# Patient Record
Sex: Male | Born: 1985 | Race: White | Hispanic: No | Marital: Single | Smoking: Current every day smoker
Health system: Southern US, Community
[De-identification: ages and names within clinical notes are randomized; demographics above are authoritative.]

## PROBLEM LIST (undated history)

## (undated) DIAGNOSIS — R233 Spontaneous ecchymoses: Secondary | ICD-10-CM

## (undated) DIAGNOSIS — R238 Other skin changes: Secondary | ICD-10-CM

## (undated) HISTORY — DX: Spontaneous ecchymoses: R23.3

## (undated) HISTORY — DX: Other skin changes: R23.8

---
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
## 2005-07-23 ENCOUNTER — Emergency Department (HOSPITAL_COMMUNITY): Admission: EM | Admit: 2005-07-23 | Discharge: 2005-07-24 | Payer: Self-pay | Admitting: Emergency Medicine

## 2011-11-18 ENCOUNTER — Encounter (HOSPITAL_COMMUNITY): Payer: Self-pay | Admitting: *Deleted

## 2011-11-18 ENCOUNTER — Emergency Department (HOSPITAL_COMMUNITY): Payer: PRIVATE HEALTH INSURANCE

## 2011-11-18 ENCOUNTER — Observation Stay (HOSPITAL_COMMUNITY)
Admission: EM | Admit: 2011-11-18 | Discharge: 2011-11-19 | Disposition: A | Discharge status: Home / Self Care | Payer: PRIVATE HEALTH INSURANCE | Attending: General Surgery | Admitting: General Surgery

## 2011-11-18 DIAGNOSIS — S71009A Unspecified open wound, unspecified hip, initial encounter: Principal | ICD-10-CM | POA: Insufficient documentation

## 2011-11-18 DIAGNOSIS — F1010 Alcohol abuse, uncomplicated: Secondary | ICD-10-CM | POA: Insufficient documentation

## 2011-11-18 DIAGNOSIS — F17200 Nicotine dependence, unspecified, uncomplicated: Secondary | ICD-10-CM | POA: Insufficient documentation

## 2011-11-18 DIAGNOSIS — T07XXXA Unspecified multiple injuries, initial encounter: Secondary | ICD-10-CM | POA: Diagnosis present

## 2011-11-18 DIAGNOSIS — S41009A Unspecified open wound of unspecified shoulder, initial encounter: Secondary | ICD-10-CM | POA: Insufficient documentation

## 2011-11-18 DIAGNOSIS — D62 Acute posthemorrhagic anemia: Secondary | ICD-10-CM | POA: Insufficient documentation

## 2011-11-18 DIAGNOSIS — S71109A Unspecified open wound, unspecified thigh, initial encounter: Secondary | ICD-10-CM

## 2011-11-18 DIAGNOSIS — Y998 Other external cause status: Secondary | ICD-10-CM | POA: Insufficient documentation

## 2011-11-18 DIAGNOSIS — S21209A Unspecified open wound of unspecified back wall of thorax without penetration into thoracic cavity, initial encounter: Secondary | ICD-10-CM

## 2011-11-18 LAB — POCT I-STAT, CHEM 8
BUN: 10 mg/dL (ref 6–23)
Calcium, Ion: 1.19 mmol/L (ref 1.12–1.23)
Creatinine, Ser: 1.2 mg/dL (ref 0.50–1.35)
TCO2: 25 mmol/L (ref 0–100)

## 2011-11-18 LAB — CBC
MCHC: 34.4 g/dL (ref 30.0–36.0)
RDW: 12.2 % (ref 11.5–15.5)

## 2011-11-18 LAB — COMPREHENSIVE METABOLIC PANEL
ALT: 22 U/L (ref 0–53)
Alkaline Phosphatase: 76 U/L (ref 39–117)
CO2: 27 mEq/L (ref 19–32)
GFR calc Af Amer: >90 mL/min (ref >90)
GFR calc non Af Amer: >90 mL/min (ref >90)
Glucose, Bld: 125 mg/dL — ABNORMAL HIGH (ref 70–99)
Potassium: 3.5 mEq/L (ref 3.5–5.1)
Sodium: 141 mEq/L (ref 135–145)
Total Bilirubin: 0.3 mg/dL (ref 0.3–1.2)

## 2011-11-18 LAB — PROTIME-INR: Prothrombin Time: 13.5 seconds (ref 11.6–15.2)

## 2011-11-18 LAB — LACTIC ACID, PLASMA: Lactic Acid, Venous: 3.2 mmol/L — ABNORMAL HIGH (ref 0.5–2.2)

## 2011-11-18 MED ORDER — MORPHINE SULFATE 2 MG/ML IJ SOLN: 2.0000 mg | INTRAMUSCULAR | Status: DC | PRN

## 2011-11-18 MED ORDER — ENOXAPARIN SODIUM 40 MG/0.4ML ~~LOC~~ SOLN
40.0000 mg | SUBCUTANEOUS | Status: DC
  Filled 2011-11-18: qty 0.4

## 2011-11-18 MED ORDER — ONDANSETRON HCL 4 MG/2ML IJ SOLN: 4.0000 mg | Freq: Four times a day (QID) | INTRAMUSCULAR | Status: DC | PRN

## 2011-11-18 MED ORDER — TETANUS-DIPHTH-ACELL PERTUSSIS 5-2.5-18.5 LF-MCG/0.5 IM SUSP
INTRAMUSCULAR | Status: AC
  Administered 2011-11-18: 0.5 mL via INTRAMUSCULAR
  Filled 2011-11-18: qty 0.5

## 2011-11-18 MED ORDER — TETANUS-DIPHTHERIA TOXOIDS TD 5-2 LFU IM INJ: 0.5000 mL | INJECTION | Freq: Once | INTRAMUSCULAR | Status: DC

## 2011-11-18 MED ORDER — OXYCODONE HCL 5 MG PO TABS: 2.5000 mg | ORAL_TABLET | ORAL | Status: DC | PRN

## 2011-11-18 MED ORDER — MORPHINE SULFATE 4 MG/ML IJ SOLN
4.0000 mg | INTRAMUSCULAR | Status: DC | PRN
  Administered 2011-11-19: 4 mg via INTRAVENOUS
  Filled 2011-11-18: qty 1

## 2011-11-18 MED ORDER — MORPHINE SULFATE 4 MG/ML IJ SOLN
4.0000 mg | Freq: Once | INTRAMUSCULAR | Status: AC
  Administered 2011-11-18: 4 mg via INTRAVENOUS

## 2011-11-18 MED ORDER — CEFAZOLIN SODIUM 1-5 GM-% IV SOLN
1.0000 g | Freq: Once | INTRAVENOUS | Status: AC
  Administered 2011-11-18: 1 g via INTRAVENOUS
  Filled 2011-11-18: qty 50

## 2011-11-18 MED ORDER — SODIUM CHLORIDE 0.9 % IV BOLUS (SEPSIS)
1000.0000 mL | Freq: Once | INTRAVENOUS | Status: AC
  Administered 2011-11-18: 500 mL via INTRAVENOUS

## 2011-11-18 MED ORDER — OXYCODONE HCL 5 MG PO TABS: 10.0000 mg | ORAL_TABLET | ORAL | Status: DC | PRN

## 2011-11-18 MED ORDER — KETOROLAC TROMETHAMINE 30 MG/ML IJ SOLN
30.0000 mg | Freq: Once | INTRAMUSCULAR | Status: AC
  Administered 2011-11-18: 30 mg via INTRAVENOUS
  Filled 2011-11-18: qty 1

## 2011-11-18 MED ORDER — ONDANSETRON HCL 4 MG PO TABS: 4.0000 mg | ORAL_TABLET | Freq: Four times a day (QID) | ORAL | Status: DC | PRN

## 2011-11-18 MED ORDER — MORPHINE SULFATE 4 MG/ML IJ SOLN
INTRAMUSCULAR | Status: AC
  Filled 2011-11-18: qty 1

## 2011-11-18 MED ORDER — CEFAZOLIN SODIUM 1-5 GM-% IV SOLN
1.0000 g | Freq: Three times a day (TID) | INTRAVENOUS | Status: DC
  Administered 2011-11-19: 1 g via INTRAVENOUS
  Filled 2011-11-18 (×3): qty 50

## 2011-11-18 MED ORDER — SODIUM CHLORIDE 0.9 % IV SOLN
INTRAVENOUS | Status: DC
  Administered 2011-11-18: 23:00:00 via INTRAVENOUS

## 2011-11-18 MED ORDER — OXYCODONE HCL 5 MG PO TABS
5.0000 mg | ORAL_TABLET | ORAL | Status: DC | PRN
  Administered 2011-11-19: 5 mg via ORAL
  Filled 2011-11-18: qty 1

## 2011-11-18 MED ORDER — TETANUS-DIPHTH-ACELL PERTUSSIS 5-2.5-18.5 LF-MCG/0.5 IM SUSP
0.5000 mL | Freq: Once | INTRAMUSCULAR | Status: AC
  Administered 2011-11-18: 0.5 mL via INTRAMUSCULAR
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
## 2011-11-18 NOTE — ED Notes (Signed)
Dr. Magnus Ivan at bedside to assess back wound
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
## 2011-11-18 NOTE — ED Notes (Signed)
Sheriff deputy at bedside to speak with pt
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
## 2011-11-19 ENCOUNTER — Observation Stay (HOSPITAL_COMMUNITY): Payer: PRIVATE HEALTH INSURANCE

## 2011-11-19 DIAGNOSIS — Z7289 Other problems related to lifestyle: Secondary | ICD-10-CM | POA: Insufficient documentation

## 2011-11-19 DIAGNOSIS — D62 Acute posthemorrhagic anemia: Secondary | ICD-10-CM | POA: Diagnosis not present

## 2011-11-19 DIAGNOSIS — Z720 Tobacco use: Secondary | ICD-10-CM | POA: Insufficient documentation

## 2011-11-19 LAB — CBC
Hemoglobin: 12 g/dL — ABNORMAL LOW (ref 13.0–17.0)
MCH: 32.3 pg (ref 26.0–34.0)
MCHC: 33.6 g/dL (ref 30.0–36.0)
MCV: 96.2 fL (ref 78.0–100.0)
Platelets: 221 10*3/uL (ref 150–400)

## 2011-11-19 MED ORDER — BACITRACIN-NEOMYCIN-POLYMYXIN 400-5-5000 EX OINT
TOPICAL_OINTMENT | CUTANEOUS | Status: AC
  Filled 2011-11-19: qty 2

## 2011-11-19 MED ORDER — METHOCARBAMOL 500 MG PO TABS: 500.0000 mg | ORAL_TABLET | Freq: Four times a day (QID) | ORAL | Status: AC | PRN

## 2011-11-19 MED ORDER — HYDROCODONE-ACETAMINOPHEN 5-325 MG PO TABS: 1.0000 | ORAL_TABLET | ORAL | Status: AC | PRN

## 2011-11-19 NOTE — Discharge Summary (Signed)
Jerry Schillo, MD, MPH, FACS Pager: [phone]  

## 2011-11-19 NOTE — Progress Notes (Signed)
Agree Jafet Wissing, MD, MPH, FACS Pager: [phone]  
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
## 2011-11-19 NOTE — Progress Notes (Signed)
Patient ID: Jerry Wu, male   DOB: 12/29/1985, 26 y.o.   MRN: [id number]   LOS: 1 day   Subjective: Wounds have started to hurt some after he got up to go get his x-ray but they're tolerable.  Objective: Vital signs in last 24 hours: Temp:  [98 F (36.7 C)-98.4 F (36.9 C)] 98.2 F (36.8 C) (08/15 0531) Pulse Rate:  [66-116] 66  (08/15 0531) Resp:  [14-34] 18  (08/15 0531) BP: (118-176)/(63-114) 118/63 mmHg (08/15 0531) SpO2:  [99 %-100 %] 99 % (08/15 0531) Weight:  [105.9 kg (233 lb 7.5 oz)] 105.9 kg (233 lb 7.5 oz) (08/14 2222)    Lab Results:  CBC  Basename 11/19/11 0640 11/18/11 2032 11/18/11 2014  WBC 10.1 -- 11.2*  HGB 12.0* 15.3 --  HCT 35.7* 45.0 --  PLT 221 -- 264    CHEST - 2 VIEW  Comparison: None.  Findings: Low lung volumes. Mild left basilar atelectasis.  Otherwise clear lungs. No pneumothorax. Staples over the  posterior left chest soft tissues. No obvious acute bony  deformity. No pleural effusion.  IMPRESSION:  Minimal left basilar atelectasis. Otherwise, no evidence of acute  cardiopulmonary disease. Specifically, no pneumothorax, pleural  effusion, or mediastinal widening.  Original Report Authenticated By: Donavan Burnet, M.D.   General appearance: alert and no distress Resp: clear to auscultation bilaterally Cardio: regular rate and rhythm GI: normal findings: bowel sounds normal and soft, non-tender Incision/Wound:Intact. Upper back wound has moderate hematoma underlying it.   Assessment/Plan: Mult SW back/left thigh ABL anemia -- Mild Tobacco/EtOH use Dispo -- Home    Freeman Caldron, PA-C Pager: [phone] General Trauma PA Pager: [phone]   11/19/2011
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
## 2011-12-01 ENCOUNTER — Emergency Department (HOSPITAL_COMMUNITY)
Admission: EM | Admit: 2011-12-01 | Discharge: 2011-12-01 | Disposition: A | Discharge status: Home / Self Care | Payer: PRIVATE HEALTH INSURANCE | Attending: Emergency Medicine | Admitting: Emergency Medicine

## 2011-12-01 ENCOUNTER — Encounter (HOSPITAL_COMMUNITY): Payer: Self-pay | Admitting: Emergency Medicine

## 2011-12-01 DIAGNOSIS — L089 Local infection of the skin and subcutaneous tissue, unspecified: Secondary | ICD-10-CM | POA: Insufficient documentation

## 2011-12-01 DIAGNOSIS — F17200 Nicotine dependence, unspecified, uncomplicated: Secondary | ICD-10-CM | POA: Insufficient documentation

## 2011-12-01 DIAGNOSIS — S21209A Unspecified open wound of unspecified back wall of thorax without penetration into thoracic cavity, initial encounter: Secondary | ICD-10-CM | POA: Insufficient documentation

## 2011-12-01 DIAGNOSIS — T148XXA Other injury of unspecified body region, initial encounter: Secondary | ICD-10-CM | POA: Insufficient documentation

## 2011-12-01 DIAGNOSIS — T07XXXA Unspecified multiple injuries, initial encounter: Secondary | ICD-10-CM

## 2011-12-01 DIAGNOSIS — M79609 Pain in unspecified limb: Secondary | ICD-10-CM

## 2011-12-01 MED ORDER — CEPHALEXIN 500 MG PO CAPS: 500.0000 mg | ORAL_CAPSULE | Freq: Four times a day (QID) | ORAL | Status: AC
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
## 2011-12-01 NOTE — ED Provider Notes (Signed)
Medical screening examination/treatment/procedure(s) were performed by non-physician practitioner and as supervising physician I was immediately available for consultation/collaboration.   Gerhard Munch, MD 12/01/11 1616

## 2011-12-01 NOTE — ED Notes (Signed)
Pt c/o lower back bruise noticed 4 days ago. States that he is having pain to left calf that is worse when walking.

## 2011-12-01 NOTE — Progress Notes (Signed)
VASCULAR LAB PRELIMINARY  PRELIMINARY  PRELIMINARY  PRELIMINARY  Left leg venous duplex completed.    Preliminary report: No obvious DVT noted in the left leg.    Nalini Alcaraz, 12/01/2011, 3:25 PM
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
## 2011-12-01 NOTE — ED Notes (Signed)
PT HAS RETURNED FROM [facility]

## 2011-12-01 NOTE — ED Notes (Signed)
Patient has a laceration to distal inner thigh that is healing with staples intact. There is redness along the staple line.. Pt denies fever. Also with resolving bruising ( fist size) to left flank at level of pelvis. And a resolving bruise to his ls spine. States has some back pain when he goes to work but is not having back pain at this time

## 2011-12-03 ENCOUNTER — Encounter (INDEPENDENT_AMBULATORY_CARE_PROVIDER_SITE_OTHER): Payer: Self-pay

## 2011-12-03 ENCOUNTER — Ambulatory Visit (INDEPENDENT_AMBULATORY_CARE_PROVIDER_SITE_OTHER): Payer: PRIVATE HEALTH INSURANCE | Admitting: Orthopedic Surgery

## 2011-12-03 VITALS — BP 138/84 | HR 68 | Temp 96.0°F | Resp 18 | Ht 68.0 in | Wt 227.1 lb

## 2011-12-03 DIAGNOSIS — L02419 Cutaneous abscess of limb, unspecified: Secondary | ICD-10-CM | POA: Insufficient documentation

## 2011-12-03 DIAGNOSIS — T07XXXA Unspecified multiple injuries, initial encounter: Secondary | ICD-10-CM

## 2011-12-03 DIAGNOSIS — L03119 Cellulitis of unspecified part of limb: Secondary | ICD-10-CM

## 2011-12-03 DIAGNOSIS — T148XXA Other injury of unspecified body region, initial encounter: Secondary | ICD-10-CM
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
## 2014-02-20 ENCOUNTER — Emergency Department (HOSPITAL_COMMUNITY)
Admission: EM | Admit: 2014-02-20 | Discharge: 2014-02-20 | Disposition: A | Discharge status: Home / Self Care | Payer: PRIVATE HEALTH INSURANCE | Attending: Emergency Medicine | Admitting: Emergency Medicine

## 2014-02-20 ENCOUNTER — Encounter (HOSPITAL_COMMUNITY): Payer: Self-pay | Admitting: *Deleted

## 2014-02-20 DIAGNOSIS — L03115 Cellulitis of right lower limb: Secondary | ICD-10-CM

## 2014-02-20 DIAGNOSIS — R110 Nausea: Secondary | ICD-10-CM | POA: Insufficient documentation

## 2014-02-20 DIAGNOSIS — Z720 Tobacco use: Secondary | ICD-10-CM | POA: Insufficient documentation

## 2014-02-20 LAB — BASIC METABOLIC PANEL
ANION GAP: 11 (ref 5–15)
BUN: 5 mg/dL — ABNORMAL LOW (ref 6–23)
CHLORIDE: 98 meq/L (ref 96–112)
CO2: 26 mEq/L (ref 19–32)
CREATININE: 0.82 mg/dL (ref 0.50–1.35)
Calcium: 9.5 mg/dL (ref 8.4–10.5)
GFR calc non Af Amer: >90 mL/min (ref >90)
Glucose, Bld: 94 mg/dL (ref 70–99)
POTASSIUM: 4.8 meq/L (ref 3.7–5.3)
Sodium: 135 mEq/L — ABNORMAL LOW (ref 137–147)

## 2014-02-20 LAB — CBC WITH DIFFERENTIAL/PLATELET
BASOS ABS: 0 10*3/uL (ref 0.0–0.1)
BASOS PCT: 0 % (ref 0–1)
Eosinophils Absolute: 0.2 10*3/uL (ref 0.0–0.7)
Eosinophils Relative: 2 % (ref 0–5)
HCT: 44.4 % (ref 39.0–52.0)
Hemoglobin: 15.1 g/dL (ref 13.0–17.0)
Lymphocytes Relative: 16 % (ref 12–46)
Lymphs Abs: 1.7 10*3/uL (ref 0.7–4.0)
MCH: 33 pg (ref 26.0–34.0)
MCHC: 34 g/dL (ref 30.0–36.0)
MCV: 96.9 fL (ref 78.0–100.0)
Monocytes Absolute: 1.4 10*3/uL — ABNORMAL HIGH (ref 0.1–1.0)
Monocytes Relative: 13 % — ABNORMAL HIGH (ref 3–12)
NEUTROS ABS: 7.7 10*3/uL (ref 1.7–7.7)
NEUTROS PCT: 69 % (ref 43–77)
PLATELETS: 227 10*3/uL (ref 150–400)
RBC: 4.58 MIL/uL (ref 4.22–5.81)
RDW: 12.6 % (ref 11.5–15.5)
WBC: 10.9 10*3/uL — ABNORMAL HIGH (ref 4.0–10.5)

## 2014-02-20 MED ORDER — HYDROCODONE-ACETAMINOPHEN 5-325 MG PO TABS: 1.0000 | ORAL_TABLET | Freq: Four times a day (QID) | ORAL | Status: AC | PRN

## 2014-02-20 MED ORDER — CLINDAMYCIN HCL 150 MG PO CAPS: 450.0000 mg | ORAL_CAPSULE | Freq: Three times a day (TID) | ORAL | Status: AC

## 2014-02-20 MED ORDER — CLINDAMYCIN PHOSPHATE 600 MG/50ML IV SOLN
600.0000 mg | Freq: Once | INTRAVENOUS | Status: AC
  Administered 2014-02-20: 600 mg via INTRAVENOUS
  Filled 2014-02-20: qty 50

## 2014-02-20 NOTE — ED Notes (Signed)
PA Parker at bedside. 

## 2014-02-20 NOTE — ED Provider Notes (Signed)
CSN: [id number]     Arrival date & time 02/20/14  1132 History   First MD Initiated Contact with Patient 02/20/14 1331     Chief Complaint  Patient presents with  . Cellulitis  . Leg Pain     (Consider location/radiation/quality/duration/timing/severity/associated sxs/prior Treatment) HPI Comments: The patient is a 28 year old male presenting to the emergency room chief complaint of right lower extremity redness and pain for 3 days. Patient reports worsening redness, pain with movement and pressure.  Patient denies known skin lesion. Reports nausea, no emesis. Denies fever or chills.  Patient is a 28 y.o. male presenting with leg pain. The history is provided by the patient. No language interpreter was used.  Leg Pain Associated symptoms: no fever     Past Medical History  Diagnosis Date  . Easy bruising    History reviewed. No pertinent past surgical history. Family History  Problem Relation Age of Onset  . Factor V Leiden deficiency Mother    History  Substance Use Topics  . Smoking status: Current Every Day Smoker -- 0.50 packs/day  . Smokeless tobacco: Not on file  . Alcohol Use: Yes     Comment: occasionally    Review of Systems  Constitutional: Negative for fever and chills.  Gastrointestinal: Positive for nausea. Negative for vomiting.  Skin: Positive for color change.      Allergies  Review of patient's allergies indicates no known allergies.  Home Medications   Prior to Admission medications   Not on File   BP 131/113 mmHg  Pulse 80  Temp(Src) 97.9 F (36.6 C) (Oral)  Resp 20  Ht 5\' 9"  (1.753 m)  Wt 210 lb (95.255 kg)  BMI 31.00 kg/m2  SpO2 97% Physical Exam  Constitutional: He is oriented to person, place, and time. He appears well-developed and well-nourished.  HENT:  Head: Normocephalic and atraumatic.  Neck: Neck supple.  Cardiovascular: Normal rate and regular rhythm.   Pulmonary/Chest: Effort normal. No respiratory distress.    Musculoskeletal: Normal range of motion.       Legs: Large erythremic lesion to dorsal aspect of lower limb. Increase in warmth to touch, minimal edema.  Neurological: He is alert and oriented to person, place, and time.  Nursing note and vitals reviewed.   ED Course  Procedures (including critical care time) Labs Review Labs Reviewed  CBC WITH DIFFERENTIAL - Abnormal; Notable for the following:    WBC 10.9 (*)    Monocytes Relative 13 (*)    Monocytes Absolute 1.4 (*)    All other components within normal limits  BASIC METABOLIC PANEL - Abnormal; Notable for the following:    Sodium 135 (*)    BUN 5 (*)    All other components within normal limits    Imaging Review No results found.   EKG Interpretation None      MDM   Final diagnoses:  Cellulitis of right lower extremity   Patient presents with cellulitis of right lower extremity, non-cercumfrential, afebrile. Discussed with Dr. Estell HarpinZammit who after evaluating the patient advises basic labs IV antibiotics. Plan to treat with antibiotics, follow-up in 48 hours for wound check. Discussed treatment plan with the patient. Return precautions given. Reports understanding and no other concerns at this time.  Patient is stable for discharge at this time. Meds given in ED:  Medications  clindamycin (CLEOCIN) IVPB 600 mg (0 mg Intravenous Stopped 02/20/14 1556)    New Prescriptions   CLINDAMYCIN (CLEOCIN) 150 MG CAPSULE    Take  3 capsules (450 mg total) by mouth 3 (three) times daily.   HYDROCODONE-ACETAMINOPHEN (NORCO/VICODIN) 5-325 MG PER TABLET    Take 1 tablet by mouth every 6 (six) hours as needed for moderate pain or severe pain.      Mellody DrownLauren Jalei Shibley, PA-C 02/20/14 1600  Benny LennertJoseph L Zammit, MD 02/20/14 [phone]

## 2014-02-20 NOTE — Discharge Instructions (Signed)
Return to the ED in 48 hors for cellulitis check, return sooner if symptoms worsen. Call for a follow up appointment with a Family or Primary Care Provider.  Return if Symptoms worsen.   Take medication as prescribed.

## 2014-02-20 NOTE — ED Notes (Signed)
Rt. Leg: redness and warmth all away the around.  Hurts to walk. More swollen than normal. Family hx. Of factor 5.
